# Patient Record
Sex: Female | Born: 1995 | Race: Black or African American | Hispanic: No | Marital: Single | Smoking: Never smoker
Health system: Southern US, Community
[De-identification: ages and names within clinical notes are randomized; demographics above are authoritative.]

## PROBLEM LIST (undated history)

## (undated) DIAGNOSIS — Z789 Other specified health status: Secondary | ICD-10-CM

## (undated) HISTORY — PX: STOMACH SURGERY: SHX791

## (undated) HISTORY — PX: HERNIA REPAIR: SHX51

---
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
## 2018-05-09 ENCOUNTER — Ambulatory Visit: Payer: Medicaid Other | Admitting: Certified Nurse Midwife

## 2018-07-03 ENCOUNTER — Ambulatory Visit: Payer: Medicaid Other | Admitting: Certified Nurse Midwife

## 2018-07-19 ENCOUNTER — Ambulatory Visit (INDEPENDENT_AMBULATORY_CARE_PROVIDER_SITE_OTHER): Payer: Medicaid Other | Admitting: Medical

## 2018-07-19 ENCOUNTER — Other Ambulatory Visit (HOSPITAL_COMMUNITY)
Admission: RE | Admit: 2018-07-19 | Discharge: 2018-07-19 | Disposition: A | Discharge status: Home / Self Care | Payer: Medicaid Other | Source: Ambulatory Visit | Attending: Medical | Admitting: Medical

## 2018-07-19 ENCOUNTER — Encounter: Payer: Self-pay | Admitting: Medical

## 2018-07-19 ENCOUNTER — Ambulatory Visit: Payer: Medicaid Other | Admitting: Medical

## 2018-07-19 VITALS — BP 111/69 | HR 78 | Ht 63.0 in | Wt 134.8 lb

## 2018-07-19 DIAGNOSIS — N760 Acute vaginitis: Secondary | ICD-10-CM

## 2018-07-19 DIAGNOSIS — Z01419 Encounter for gynecological examination (general) (routine) without abnormal findings: Secondary | ICD-10-CM | POA: Diagnosis present

## 2018-07-19 DIAGNOSIS — A749 Chlamydial infection, unspecified: Secondary | ICD-10-CM

## 2018-07-19 DIAGNOSIS — Z0000 Encounter for general adult medical examination without abnormal findings: Secondary | ICD-10-CM | POA: Diagnosis not present

## 2018-07-19 DIAGNOSIS — B9689 Other specified bacterial agents as the cause of diseases classified elsewhere: Secondary | ICD-10-CM

## 2018-07-19 NOTE — Patient Instructions (Signed)
Pap Test  Why am I having this test?  A Pap test, also called a Pap smear, is a screening test to check for signs of:  · Cancer of the vagina, cervix, and uterus. The cervix is the lower part of the uterus that opens into the vagina.  · Infection.  · Changes that may be a sign that cancer is developing (precancerous changes).  Women need this test on a regular basis. In general, you should have a Pap test every 3 years until you reach menopause or age 23. Women aged 30-60 may choose to have their Pap test done at the same time as an HPV (human papillomavirus) test every 5 years (instead of every 3 years).  Your health care provider may recommend having Pap tests more or less often depending on your medical conditions and past Pap test results.  What kind of sample is taken?    Your health care provider will collect a sample of cells from the surface of your cervix. This will be done using a small cotton swab, plastic spatula, or brush. This sample is often collected during a pelvic exam, when you are lying on your back on an exam table with feet in footrests (stirrups).  In some cases, fluids (secretions) from the cervix or vagina may also be collected.  How do I prepare for this test?  · Be aware of where you are in your menstrual cycle. If you are menstruating on the day of the test, you may be asked to reschedule.  · You may need to reschedule if you have a known vaginal infection on the day of the test.  · Follow instructions from your health care provider about:  ? Changing or stopping your regular medicines. Some medicines can cause abnormal test results, such as digitalis and tetracycline.  ? Avoiding douching or taking a bath the day before or the day of the test.  Tell a health care provider about:  · Any allergies you have.  · All medicines you are taking, including vitamins, herbs, eye drops, creams, and over-the-counter medicines.  · Any blood disorders you have.  · Any surgeries you have had.  · Any  medical conditions you have.  · Whether you are pregnant or may be pregnant.  How are the results reported?  Your test results will be reported as either abnormal or normal.  A false-positive result can occur. A false positive is incorrect because it means that a condition is present when it is not.  A false-negative result can occur. A false negative is incorrect because it means that a condition is not present when it is.  What do the results mean?  A normal test result means that you do not have signs of cancer of the vagina, cervix, or uterus.  An abnormal result may mean that you have:  · Cancer. A Pap test by itself is not enough to diagnose cancer. You will have more tests done in this case.  · Precancerous changes in your vagina, cervix, or uterus.  · Inflammation of the cervix.  · An STD (sexually transmitted disease).  · A fungal infection.  · A parasite infection.  Talk with your health care provider about what your results mean.  Questions to ask your health care provider  Ask your health care provider, or the department that is doing the test:  · When will my results be ready?  · How will I get my results?  · What are my   treatment options?  · What other tests do I need?  · What are my next steps?  Summary  · In general, women should have a Pap test every 3 years until they reach menopause or age 23.  · Your health care provider will collect a sample of cells from the surface of your cervix. This will be done using a small cotton swab, plastic spatula, or brush.  · In some cases, fluids (secretions) from the cervix or vagina may also be collected.  This information is not intended to replace advice given to you by your health care provider. Make sure you discuss any questions you have with your health care provider.  Document Released: 08/05/2002 Document Revised: 01/22/2017 Document Reviewed: 01/22/2017  Elsevier Interactive Patient Education © 2019 [employer]

## 2018-07-19 NOTE — Progress Notes (Signed)
Subjective:    Carly Howell is a 23 y.o. female who presents for an annual exam. The patient has complaint of white vaginal discharge x 1 week. The patient is sexually active. GYN screening history: last pap: approximate date 2019 and was normal. The patient wears seatbelts: yes. The patient participates in regular exercise: no. Has the patient ever been transfused or tattooed?: No transfusion, yes Tattooes. The patient reports that there is not domestic violence in her life.   Menstrual History: OB History    Gravida  1   Para  1   Term  1   Preterm      AB      Living  1     SAB      TAB      Ectopic      Multiple      Live Births  1           Menarche age: 23 years old No LMP recorded. LMP 07/08/18    The following portions of the patient's history were reviewed and updated as appropriate: allergies, current medications, past family history, past medical history, past social history, past surgical history and problem list.  Review of Systems Pertinent items are noted in HPI.    Objective:   Physical Exam  Nursing note and vitals reviewed. Constitutional: She is oriented to person, place, and time. She appears well-developed and well-nourished. No distress.  HENT:  Head: Normocephalic and atraumatic.  Eyes: EOM are normal.  Cardiovascular: Normal rate, regular rhythm and normal heart sounds.  No murmur heard. Respiratory: Effort normal and breath sounds normal. No respiratory distress. She has no wheezes.  GI: Soft. Bowel sounds are normal. She exhibits no distension and no mass. There is no abdominal tenderness. There is no rebound and no guarding.  Genitourinary: Uterus is not enlarged and not tender. Cervix exhibits no motion tenderness, no discharge and no friability. Right adnexum displays no mass and no tenderness. Left adnexum displays no mass and no tenderness.    Vaginal discharge (small, white) present.     No vaginal bleeding.  No bleeding in the  vagina.  Neurological: She is alert and oriented to person, place, and time.  Skin: Skin is warm and dry. No erythema.  Psychiatric: She has a normal mood and affect.      Assessment:    Healthy female exam.   STD testing today    Plan:     All questions answered.   Await testing results Patient encouraged to sign up for MyChart for normal results Will call with abnormal results ROI signed for pap smear from [facility] from last year   Kathlene Cote 07/19/2018 10:55 AM

## 2018-07-19 NOTE — Progress Notes (Signed)
New GYN in office for annual, currently has Paraguard IUD. Pt desires std testing today.

## 2018-07-22 LAB — CERVICOVAGINAL ANCILLARY ONLY
Bacterial vaginitis: POSITIVE — AB
Candida vaginitis: NEGATIVE
Chlamydia: POSITIVE — AB
Neisseria Gonorrhea: NEGATIVE
TRICH (WINDOWPATH): NEGATIVE

## 2018-07-23 ENCOUNTER — Telehealth: Payer: Self-pay

## 2018-07-23 MED ORDER — METRONIDAZOLE 500 MG PO TABS: 500.0000 mg | ORAL_TABLET | Freq: Two times a day (BID) | ORAL | 0 refills | Status: DC

## 2018-07-23 MED ORDER — AZITHROMYCIN 250 MG PO TABS: 1000.0000 mg | ORAL_TABLET | Freq: Once | ORAL | 0 refills | Status: AC

## 2018-07-23 NOTE — Telephone Encounter (Signed)
Patient called requesting explanation of lab results - advised of results: Dx BV and Chlamydia;Rxs sent to pharmacy - verified/confirmes; partner needs to be treated.   Patient stated she understood and had no further questions.

## 2018-07-23 NOTE — Addendum Note (Signed)
Addended by: Marny Lowenstein on: 07/23/2018 12:26 PM   Modules accepted: Orders

## 2018-07-23 NOTE — Telephone Encounter (Signed)
[facility] report faxed.

## 2019-01-09 ENCOUNTER — Ambulatory Visit: Payer: Medicaid Other | Admitting: Advanced Practice Midwife

## 2019-01-21 ENCOUNTER — Ambulatory Visit: Payer: Medicaid Other | Admitting: Advanced Practice Midwife

## 2019-03-14 ENCOUNTER — Ambulatory Visit: Payer: Medicaid Other

## 2019-03-14 ENCOUNTER — Other Ambulatory Visit: Payer: Self-pay

## 2019-03-14 ENCOUNTER — Other Ambulatory Visit (HOSPITAL_COMMUNITY)
Admission: RE | Admit: 2019-03-14 | Discharge: 2019-03-14 | Disposition: A | Discharge status: Home / Self Care | Payer: Medicaid Other | Source: Ambulatory Visit | Attending: Obstetrics and Gynecology | Admitting: Obstetrics and Gynecology

## 2019-03-14 DIAGNOSIS — N898 Other specified noninflammatory disorders of vagina: Secondary | ICD-10-CM

## 2019-03-14 NOTE — Progress Notes (Signed)
Patient is in the office for self swab. Patient reports discharge, requests full std panel.

## 2019-03-15 LAB — RPR: RPR Ser Ql: NONREACTIVE

## 2019-03-15 LAB — HEPATITIS B SURFACE ANTIGEN: Hepatitis B Surface Ag: NEGATIVE

## 2019-03-15 LAB — HEPATITIS C ANTIBODY: Hep C Virus Ab: <0.1 s/co ratio (ref 0.0–0.9)

## 2019-03-15 LAB — HIV ANTIBODY (ROUTINE TESTING W REFLEX): HIV Screen 4th Generation wRfx: NONREACTIVE

## 2019-03-19 LAB — CERVICOVAGINAL ANCILLARY ONLY
Bacterial Vaginitis (gardnerella): POSITIVE — AB
Candida Glabrata: NEGATIVE
Candida Vaginitis: NEGATIVE
Chlamydia: NEGATIVE
Comment: NEGATIVE
Comment: NEGATIVE
Comment: NEGATIVE
Comment: NEGATIVE
Comment: NEGATIVE
Comment: NORMAL
Neisseria Gonorrhea: NEGATIVE
Trichomonas: NEGATIVE

## 2019-03-20 ENCOUNTER — Other Ambulatory Visit: Payer: Self-pay | Admitting: Family Medicine

## 2019-03-20 DIAGNOSIS — N760 Acute vaginitis: Secondary | ICD-10-CM

## 2019-03-20 DIAGNOSIS — B9689 Other specified bacterial agents as the cause of diseases classified elsewhere: Secondary | ICD-10-CM

## 2019-03-20 MED ORDER — METRONIDAZOLE 500 MG PO TABS
500.0000 mg | ORAL_TABLET | Freq: Two times a day (BID) | ORAL | 0 refills | Status: DC
Start: 2019-03-20 — End: 2020-07-13

## 2019-03-20 NOTE — Progress Notes (Signed)
BV treatment Notified by [employer]

## 2019-08-12 ENCOUNTER — Ambulatory Visit: Payer: Medicaid Other

## 2020-03-12 ENCOUNTER — Ambulatory Visit: Payer: Medicaid Other | Admitting: Family Medicine

## 2020-04-16 ENCOUNTER — Ambulatory Visit: Payer: Medicaid Other | Admitting: Obstetrics and Gynecology

## 2020-05-17 ENCOUNTER — Ambulatory Visit: Payer: Medicaid Other | Admitting: Obstetrics and Gynecology

## 2020-07-09 ENCOUNTER — Ambulatory Visit (INDEPENDENT_AMBULATORY_CARE_PROVIDER_SITE_OTHER): Payer: Medicaid Other | Admitting: Obstetrics

## 2020-07-09 ENCOUNTER — Other Ambulatory Visit: Payer: Self-pay

## 2020-07-09 ENCOUNTER — Other Ambulatory Visit (HOSPITAL_COMMUNITY)
Admission: RE | Admit: 2020-07-09 | Discharge: 2020-07-09 | Disposition: A | Discharge status: Home / Self Care | Payer: Medicaid Other | Source: Ambulatory Visit | Attending: Obstetrics | Admitting: Obstetrics

## 2020-07-09 ENCOUNTER — Encounter: Payer: Self-pay | Admitting: Obstetrics

## 2020-07-09 VITALS — BP 118/78 | HR 80 | Wt 156.0 lb

## 2020-07-09 DIAGNOSIS — N898 Other specified noninflammatory disorders of vagina: Secondary | ICD-10-CM

## 2020-07-09 DIAGNOSIS — Z113 Encounter for screening for infections with a predominantly sexual mode of transmission: Secondary | ICD-10-CM | POA: Diagnosis not present

## 2020-07-09 DIAGNOSIS — Z30013 Encounter for initial prescription of injectable contraceptive: Secondary | ICD-10-CM

## 2020-07-09 DIAGNOSIS — Z01419 Encounter for gynecological examination (general) (routine) without abnormal findings: Secondary | ICD-10-CM | POA: Diagnosis not present

## 2020-07-09 DIAGNOSIS — Z3009 Encounter for other general counseling and advice on contraception: Secondary | ICD-10-CM | POA: Diagnosis not present

## 2020-07-09 MED ORDER — MEDROXYPROGESTERONE ACETATE 150 MG/ML IM SUSP: 150.0000 mg | INTRAMUSCULAR | 4 refills | Status: AC

## 2020-07-09 MED ORDER — MEDROXYPROGESTERONE ACETATE 150 MG/ML IM SUSP
150.0000 mg | Freq: Once | INTRAMUSCULAR | Status: AC
  Administered 2020-07-09: 150 mg via INTRAMUSCULAR

## 2020-07-09 NOTE — Progress Notes (Signed)
Pt presents for annual requests ParaGard IUD removal. ParaGard in place since 2015. Pt desires Depo. Depo given RD without difficulty Next Depo due Apr 29-May 13

## 2020-07-09 NOTE — Progress Notes (Signed)
GYNECOLOGY OFFICE PROCEDURE NOTE  Carly Howell is a 25 y.o. G1P1001 here for ParaGuard IUD removal. No GYN concerns.    IUD Removal  Patient identified, informed consent performed, consent signed.  Patient was in the dorsal lithotomy position, normal external genitalia was noted.  A speculum was placed in the patient's vagina, normal discharge was noted, no lesions. The cervix was visualized, no lesions, no abnormal discharge.  The strings of the IUD were grasped and pulled using ring forceps. The IUD was removed in its entirety.  Patient tolerated the procedure well.    Patient will use Depo Provera for contraception.   Routine preventative health maintenance measures emphasized.  Brock Bad, MD, FACOG Obstetrician & Gynecologist, [facility] for [facility], [facility] 07/09/2020   Subjective:        Carly Howell is a 25 y.o. female here for a routine exam.  Current complaints: Vaginal discharge.    Personal health questionnaire:  Is patient Ashkenazi Jewish, have a family history of breast and/or ovarian cancer: no Is there a family history of uterine cancer diagnosed at age < 62, gastrointestinal cancer, urinary tract cancer, family member who is a Personnel officer syndrome-associated carrier: no Is the patient overweight and hypertensive, family history of diabetes, personal history of gestational diabetes, preeclampsia or PCOS: no Is patient over 70, have PCOS,  family history of premature CHD under age 62, diabetes, smoke, have hypertension or peripheral artery disease:  no At any time, has a partner hit, kicked or otherwise hurt or frightened you?: no Over the past 2 weeks, have you felt down, depressed or hopeless?: no Over the past 2 weeks, have you felt little interest or pleasure in doing things?:no   Gynecologic History Patient's last menstrual period was 06/21/2020. Contraception: IUD Last Pap: unknown. Results were: unknown Last  mammogram: n/a. Results were: n/a  Obstetric History OB History  Gravida Para Term Preterm AB Living  1 1 1     1   SAB IAB Ectopic Multiple Live Births          1    # Outcome Date GA Lbr Len/2nd Weight Sex Delivery Anes PTL Lv  1 Term 08/06/13 [redacted]w[redacted]d 08:12 / 00:15 5 lb 12.4 oz (2.62 kg) M Vag-Spont EPI  LIV    Past Medical History:  Diagnosis Date  . Medical history non-contributory     Past Surgical History:  Procedure Laterality Date  . HERNIA REPAIR     at age 79  . STOMACH SURGERY       Current Outpatient Medications:  .  medroxyPROGESTERone (DEPO-PROVERA) 150 MG/ML injection, Inject 1 mL (150 mg total) into the muscle every 3 (three) months., Disp: 1 mL, Rfl: 4 .  diphenhydrAMINE (BENADRYL) 25 MG tablet, Take 1 tablet (25 mg total) by mouth every 6 (six) hours. (Patient not taking: No sig reported), Disp: 20 tablet, Rfl: 0 .  ibuprofen (ADVIL,MOTRIN) 600 MG tablet, Take 1 tablet (600 mg total) by mouth every 6 (six) hours. (Patient not taking: No sig reported), Disp: 50 tablet, Rfl: 1 .  metroNIDAZOLE (FLAGYL) 500 MG tablet, Take 1 tablet (500 mg total) by mouth 2 (two) times daily. (Patient not taking: Reported on 07/09/2020), Disp: 14 tablet, Rfl: 0 .  predniSONE (DELTASONE) 20 MG tablet, Take 2 tablets (40 mg total) by mouth daily. (Patient not taking: No sig reported), Disp: 10 tablet, Rfl: 0 .  Prenatal Vit-Fe Fumarate-FA (PRENATAL MULTIVITAMIN) TABS tablet, Take 1 tablet by  mouth daily at 12 noon. (Patient not taking: Reported on 07/09/2020), Disp: , Rfl:  .  ranitidine (ZANTAC) 150 MG tablet, Take 1 tablet (150 mg total) by mouth 2 (two) times daily. (Patient not taking: No sig reported), Disp: 60 tablet, Rfl: 0 Allergies  Allergen Reactions  . Shellfish Allergy Nausea And Vomiting and Swelling    Social History   Tobacco Use  . Smoking status: Never Smoker  . Smokeless tobacco: Never Used  Substance Use Topics  . Alcohol use: No    Family History  Problem  Relation Age of Onset  . Hypertension Mother   . Cancer Maternal Grandmother       Review of Systems  Constitutional: negative for fatigue and weight loss Respiratory: negative for cough and wheezing Cardiovascular: negative for chest pain, fatigue and palpitations Gastrointestinal: negative for abdominal pain and change in bowel habits Musculoskeletal:negative for myalgias Neurological: negative for gait problems and tremors Behavioral/Psych: negative for abusive relationship, depression Endocrine: negative for temperature intolerance    Genitourinary:negative for abnormal menstrual periods, genital lesions, hot flashes, sexual problems.  Positive for vaginal discharge Integument/breast: negative for breast lump, breast tenderness, nipple discharge and skin lesion(s)    Objective:       BP 118/78   Pulse 80   Wt 156 lb (70.8 kg)   LMP 06/21/2020   BMI 27.63 kg/m  General:   alert and no distress  Skin:   no rash or abnormalities  Lungs:   clear to auscultation bilaterally  Heart:   regular rate and rhythm, S1, S2 normal, no murmur, click, rub or gallop  Breasts:   normal without suspicious masses, skin or nipple changes or axillary nodes  Abdomen:  normal findings: no organomegaly, soft, non-tender and no hernia  Pelvis:  External genitalia: normal general appearance Urinary system: urethral meatus normal and bladder without fullness, nontender Vaginal: normal without tenderness, induration or masses Cervix: normal appearance Adnexa: normal bimanual exam Uterus: anteverted and non-tender, normal size   Lab Review Urine pregnancy test Labs reviewed yes Radiologic studies reviewed no  50% of 20 min visit spent on counseling and coordination of care.   Assessment:     1. Encounter for gynecological examination with Papanicolaou smear of cervix Rx: - Cytology - PAP( [place])  2. Vaginal discharge Rx: - Cervicovaginal ancillary only  3. Screening for STD  (sexually transmitted disease) Rx: - Hepatitis B surface antigen - Hepatitis C antibody - HIV Antibody (routine testing w rflx) - RPR  4. Encounter for counseling regarding contraception - wants to stop IUD and start Depo Provera  5. Encounter for initial prescription of injectable contraceptive Rx: - medroxyPROGESTERone (DEPO-PROVERA) injection 150 mg - medroxyPROGESTERone (DEPO-PROVERA) 150 MG/ML injection; Inject 1 mL (150 mg total) into the muscle every 3 (three) months.  Dispense: 1 mL; Refill: 4    Plan:    Education reviewed: calcium supplements, depression evaluation, low fat, low cholesterol diet, safe sex/STD prevention, self breast exams and weight bearing exercise. Contraception: Depo-Provera injections. Follow up in: 1 year.   Meds ordered this encounter  Medications  . medroxyPROGESTERone (DEPO-PROVERA) injection 150 mg  . medroxyPROGESTERone (DEPO-PROVERA) 150 MG/ML injection    Sig: Inject 1 mL (150 mg total) into the muscle every 3 (three) months.    Dispense:  1 mL    Refill:  4   Orders Placed This Encounter  Procedures  . Hepatitis B surface antigen  . Hepatitis C antibody  . HIV Antibody (routine testing w  rflx)  . RPR    Brock Bad, MD 07/09/2020 10:49 AM

## 2020-07-10 LAB — RPR: RPR Ser Ql: NONREACTIVE

## 2020-07-10 LAB — HEPATITIS B SURFACE ANTIGEN: Hepatitis B Surface Ag: NEGATIVE

## 2020-07-10 LAB — HIV ANTIBODY (ROUTINE TESTING W REFLEX): HIV Screen 4th Generation wRfx: NONREACTIVE

## 2020-07-10 LAB — HEPATITIS C ANTIBODY: Hep C Virus Ab: <0.1 s/co ratio (ref 0.0–0.9)

## 2020-07-12 LAB — CERVICOVAGINAL ANCILLARY ONLY
Bacterial Vaginitis (gardnerella): POSITIVE — AB
Candida Glabrata: NEGATIVE
Candida Vaginitis: NEGATIVE
Chlamydia: NEGATIVE
Comment: NEGATIVE
Comment: NEGATIVE
Comment: NEGATIVE
Comment: NEGATIVE
Comment: NEGATIVE
Comment: NORMAL
Neisseria Gonorrhea: NEGATIVE
Trichomonas: NEGATIVE

## 2020-07-13 ENCOUNTER — Other Ambulatory Visit: Payer: Self-pay | Admitting: Obstetrics

## 2020-07-13 DIAGNOSIS — B9689 Other specified bacterial agents as the cause of diseases classified elsewhere: Secondary | ICD-10-CM

## 2020-07-13 DIAGNOSIS — N760 Acute vaginitis: Secondary | ICD-10-CM

## 2020-07-13 MED ORDER — METRONIDAZOLE 500 MG PO TABS: 500.0000 mg | ORAL_TABLET | Freq: Two times a day (BID) | ORAL | 0 refills | Status: AC

## 2020-07-14 LAB — CYTOLOGY - PAP: Diagnosis: NEGATIVE

## 2020-09-29 ENCOUNTER — Ambulatory Visit: Payer: Medicaid Other

## 2021-07-11 ENCOUNTER — Ambulatory Visit: Payer: Medicaid Other | Admitting: Obstetrics and Gynecology

## 2021-08-01 ENCOUNTER — Other Ambulatory Visit: Payer: Self-pay

## 2021-08-01 ENCOUNTER — Ambulatory Visit (INDEPENDENT_AMBULATORY_CARE_PROVIDER_SITE_OTHER): Payer: Medicaid Other | Admitting: General Practice

## 2021-08-01 ENCOUNTER — Encounter: Payer: Self-pay | Admitting: Obstetrics and Gynecology

## 2021-08-01 VITALS — BP 125/81 | HR 93 | Ht 64.0 in | Wt 159.5 lb

## 2021-08-01 DIAGNOSIS — Z30013 Encounter for initial prescription of injectable contraceptive: Secondary | ICD-10-CM

## 2021-08-01 DIAGNOSIS — Z113 Encounter for screening for infections with a predominantly sexual mode of transmission: Secondary | ICD-10-CM

## 2021-08-01 LAB — POCT URINE PREGNANCY: Preg Test, Ur: NEGATIVE

## 2021-08-01 MED ORDER — MEDROXYPROGESTERONE ACETATE 150 MG/ML IM SUSP: 150.0000 mg | INTRAMUSCULAR | 4 refills | Status: AC

## 2021-08-01 MED ORDER — MEDROXYPROGESTERONE ACETATE 150 MG/ML IM SUSP
150.0000 mg | Freq: Once | INTRAMUSCULAR | Status: AC
  Administered 2021-08-01: 150 mg via INTRAMUSCULAR

## 2021-08-01 NOTE — Progress Notes (Signed)
Patient was assessed and managed by nursing staff during this encounter. I have reviewed the chart and agree with the documentation and plan. I have also made any necessary editorial changes. ? ?Griffin Basil, MD ?08/01/2021 4:10 PM   ?

## 2021-08-01 NOTE — Progress Notes (Signed)
Pt presents for GYN visit for birth control. Pt is requesting HIV testing today. She has no other concerns at this time.  ? ?UPT=Negative ?Depo injection given in [place]; tolerated well ?Next depo due 10-17-21 through 10-31-21 ?   ? ?Administrations This Visit   ? ? medroxyPROGESTERone (DEPO-PROVERA) injection 150 mg   ? ? Admin Date ?08/01/2021 Action ?Given Dose ?150 mg Route ?Intramuscular Administered By ?Flonnie Hailstone, CMA  ? ?  ?  ? ?  ?  ? ? ?

## 2021-08-02 LAB — HEPB+HEPC+HIV PANEL
HIV Screen 4th Generation wRfx: NONREACTIVE
Hep B C IgM: NEGATIVE
Hep B Core Total Ab: NEGATIVE
Hep B E Ab: NEGATIVE
Hep B E Ag: NEGATIVE
Hep B Surface Ab, Qual: NONREACTIVE
Hep C Virus Ab: NONREACTIVE
Hepatitis B Surface Ag: NEGATIVE

## 2021-10-26 ENCOUNTER — Ambulatory Visit: Payer: Medicaid Other
